# Patient Record
Sex: Male | Born: 2000 | Race: Black or African American | Hispanic: No | Marital: Single | Smoking: Never smoker
Health system: Southern US, Community
[De-identification: ages and names within clinical notes are randomized; demographics above are authoritative.]

## PROBLEM LIST (undated history)

## (undated) DIAGNOSIS — R569 Unspecified convulsions: Secondary | ICD-10-CM

## (undated) DIAGNOSIS — F988 Other specified behavioral and emotional disorders with onset usually occurring in childhood and adolescence: Secondary | ICD-10-CM

## (undated) HISTORY — DX: Unspecified convulsions: R56.9

## (undated) HISTORY — DX: Other specified behavioral and emotional disorders with onset usually occurring in childhood and adolescence: F98.8

## (undated) HISTORY — PX: WISDOM TOOTH EXTRACTION: SHX21

---
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
## 2019-07-23 ENCOUNTER — Ambulatory Visit: Payer: Managed Care, Other (non HMO) | Attending: Internal Medicine

## 2019-07-23 DIAGNOSIS — Z20822 Contact with and (suspected) exposure to covid-19: Secondary | ICD-10-CM

## 2019-07-24 ENCOUNTER — Encounter: Payer: Self-pay | Admitting: *Deleted

## 2019-07-24 LAB — SARS-COV-2, NAA 2 DAY TAT

## 2019-07-24 LAB — NOVEL CORONAVIRUS, NAA: SARS-CoV-2, NAA: DETECTED — AB

## 2019-11-06 ENCOUNTER — Encounter (HOSPITAL_COMMUNITY): Payer: Self-pay

## 2019-11-06 ENCOUNTER — Other Ambulatory Visit: Payer: Self-pay

## 2019-11-06 ENCOUNTER — Ambulatory Visit (HOSPITAL_COMMUNITY)
Admission: EM | Admit: 2019-11-06 | Discharge: 2019-11-06 | Disposition: A | Discharge status: Home / Self Care | Payer: BC Managed Care – PPO

## 2019-11-06 ENCOUNTER — Ambulatory Visit (INDEPENDENT_AMBULATORY_CARE_PROVIDER_SITE_OTHER): Payer: BC Managed Care – PPO

## 2019-11-06 DIAGNOSIS — S99911A Unspecified injury of right ankle, initial encounter: Secondary | ICD-10-CM | POA: Diagnosis not present

## 2019-11-06 DIAGNOSIS — M25571 Pain in right ankle and joints of right foot: Secondary | ICD-10-CM

## 2019-11-06 DIAGNOSIS — S93401A Sprain of unspecified ligament of right ankle, initial encounter: Secondary | ICD-10-CM | POA: Diagnosis not present

## 2019-11-06 MED ORDER — NAPROXEN 500 MG PO TABS: 500.0000 mg | ORAL_TABLET | Freq: Two times a day (BID) | ORAL | 0 refills | Status: AC

## 2019-11-06 NOTE — ED Provider Notes (Signed)
[facility]    CSN: [id number] Arrival date & time: 11/06/19  1234      History   Chief Complaint Chief Complaint  Patient presents with  . Ankle Pain    HPI Carlos Blevins is a 19 y.o. male.   Patient reports for ankle pain.  He reports he rolled the right ankle last night playing basketball.  He reports he does not feel to walk on it since then.  He has been using crutches he had at home.  Iced and elevated last night.  Unable to bear weight today.  Lots of pain on the outside of the ankle and some on the inside. Patient has appointment with [facility] tomorrow.      Past Medical History:  Diagnosis Date  . ADD (attention deficit disorder)   . Seizures [facility])     Patient Active Problem List   Diagnosis Date Noted  . Other convulsions 11/20/2012  . Attention deficit disorder with hyperactivity(314.01) 11/20/2012    Past Surgical History:  Procedure Laterality Date  . WISDOM TOOTH EXTRACTION         Home Medications    Prior to Admission medications   Medication Sig Start Date End Date Taking? Authorizing Provider  naproxen (NAPROSYN) 500 MG tablet Take 1 tablet (500 mg total) by mouth 2 (two) times daily. 11/06/19   Bridgid Printz, Veryl Speak, PA-C  VYVANSE 30 MG capsule Take 30 mg by mouth every morning. 09/21/19   [provider]    Family History Family History  Problem Relation Age of Onset  . Migraines Maternal Aunt   . Seizures Maternal Aunt     Social History Social History   Tobacco Use  . Smoking status: Never Smoker  . Smokeless tobacco: Never Used  Vaping Use  . Vaping Use: Never used  Substance Use Topics  . Alcohol use: Never  . Drug use: Never     Allergies   Patient has no known allergies.   Review of Systems Review of Systems   Physical Exam Triage Vital Signs ED Triage Vitals  Enc Vitals Group     BP 11/06/19 1303 (!) 110/64     Pulse Rate 11/06/19 1303 58     Resp 11/06/19 1303 16     Temp  11/06/19 1303 97.8 F (36.6 C)     Temp Source 11/06/19 1303 Oral     SpO2 11/06/19 1303 100 %     Weight --      Height --      Head Circumference --      Peak Flow --      Pain Score 11/06/19 1305 7     Pain Loc --      Pain Edu? --      Excl. in GC? --    No data found.  Updated Vital Signs BP (!) 110/64 (BP Location: Left Arm)   Pulse 58   Temp 97.8 F (36.6 C) (Oral)   Resp 16   SpO2 100%   Visual Acuity Right Eye Distance:   Left Eye Distance:   Bilateral Distance:    Right Eye Near:   Left Eye Near:    Bilateral Near:     Physical Exam Vitals and nursing note reviewed.  Constitutional:      Appearance: He is well-developed.  HENT:     Head: Normocephalic and atraumatic.  Eyes:     Conjunctiva/sclera: Conjunctivae normal.  Cardiovascular:     Rate and  Rhythm: Normal rate.     Heart sounds: No murmur heard.   Pulmonary:     Effort: Pulmonary effort is normal. No respiratory distress.  Musculoskeletal:     Comments: Significant swelling about the lateral and medial aspect of the right ankle.  There is no malleoli or tenderness, there is some tenderness across the distribution of the ATFL and deltoid ligaments.  No pain with squeeze of tib-fib.  No knee pain.  No base of the fifth metatarsal tenderness.  Neurovascularly intact  Skin:    General: Skin is warm and dry.  Neurological:     Mental Status: He is alert.      UC Treatments / Results  Labs (all labs ordered are listed, but only abnormal results are displayed) Labs Reviewed - No data to display  EKG   Radiology DG Ankle Complete Right  Result Date: 11/06/2019 CLINICAL DATA:  Right ankle injury, pain EXAM: RIGHT ANKLE - COMPLETE 3+ VIEW COMPARISON:  None. FINDINGS: There is no evidence of fracture, dislocation, or joint effusion. There is no evidence of arthropathy or other focal bone abnormality. Diffuse soft tissue edema about the ankle. IMPRESSION: No fracture or dislocation of the  right ankle. Joint spaces are preserved. Diffuse soft tissue edema about the ankle. Electronically Signed   By: Lauralyn Primes M.D.   On: 11/06/2019 13:58    Procedures Procedures (including critical care time)  Medications Ordered in UC Medications - No data to display  Initial Impression / Assessment and Plan / UC Course  I have reviewed the triage vital signs and the nursing notes.  Pertinent labs & imaging results that were available during my care of the patient were reviewed by me and considered in my medical decision making (see chart for details).     #Right ankle sprain Patient is a 19 year old presenting with right ankle sprain.  X-ray negative for fracture.  Given significant soft tissue swelling, will have him follow-up with orthopedics as he is previously scheduled.  Placed in cam walker and given crutches.  Naproxen for pain.  Recommend ice and elevation.  Encouraged to follow-up with orthopedics as previously scheduled, given moderate sprain.  Patient verbalized understanding plan of care Final Clinical Impressions(s) / UC Diagnoses   Final diagnoses:  Sprain of right ankle, unspecified ligament, initial encounter     Discharge Instructions     Keep your follow up with the orthopedic office  Use crutches and wear boot until then  Naproxen 2 times a day  Ice and elevate tonight      ED Prescriptions    Medication Sig Dispense Auth. Provider   naproxen (NAPROSYN) 500 MG tablet Take 1 tablet (500 mg total) by mouth 2 (two) times daily. 30 tablet Janylah Belgrave, Veryl Speak, PA-C     PDMP not reviewed this encounter.   Hermelinda Medicus, PA-C 11/06/19 1458

## 2019-11-06 NOTE — ED Triage Notes (Signed)
Pt c/o pain and swelling to right ankle after rolling it playing basketball last night

## 2019-11-06 NOTE — Discharge Instructions (Signed)
Keep your follow up with the orthopedic office  Use crutches and wear boot until then  Naproxen 2 times a day  Ice and elevate tonight

## 2021-08-06 IMAGING — DX DG ANKLE COMPLETE 3+V*R*
3 series · 3 of 3 positions shown · non-contrast
Comparison: None.

CLINICAL DATA: Right ankle injury, pain

EXAM:
RIGHT ANKLE - COMPLETE 3+ VIEW

[ankle ap]
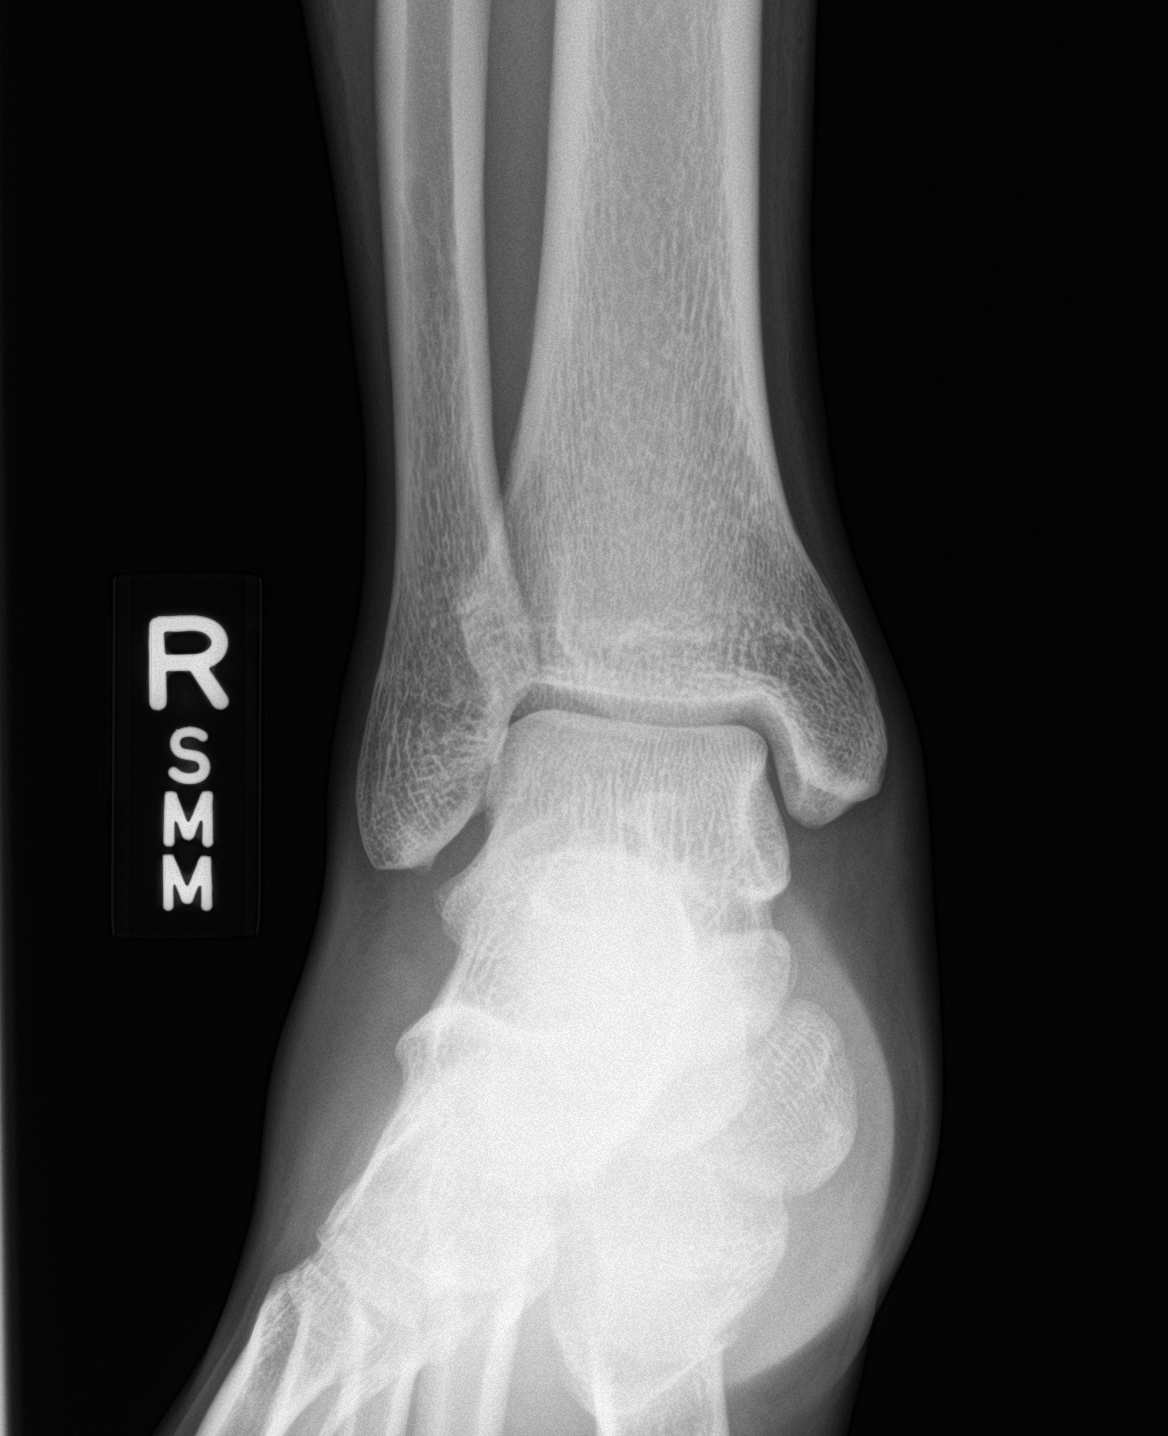

[ankle obl]
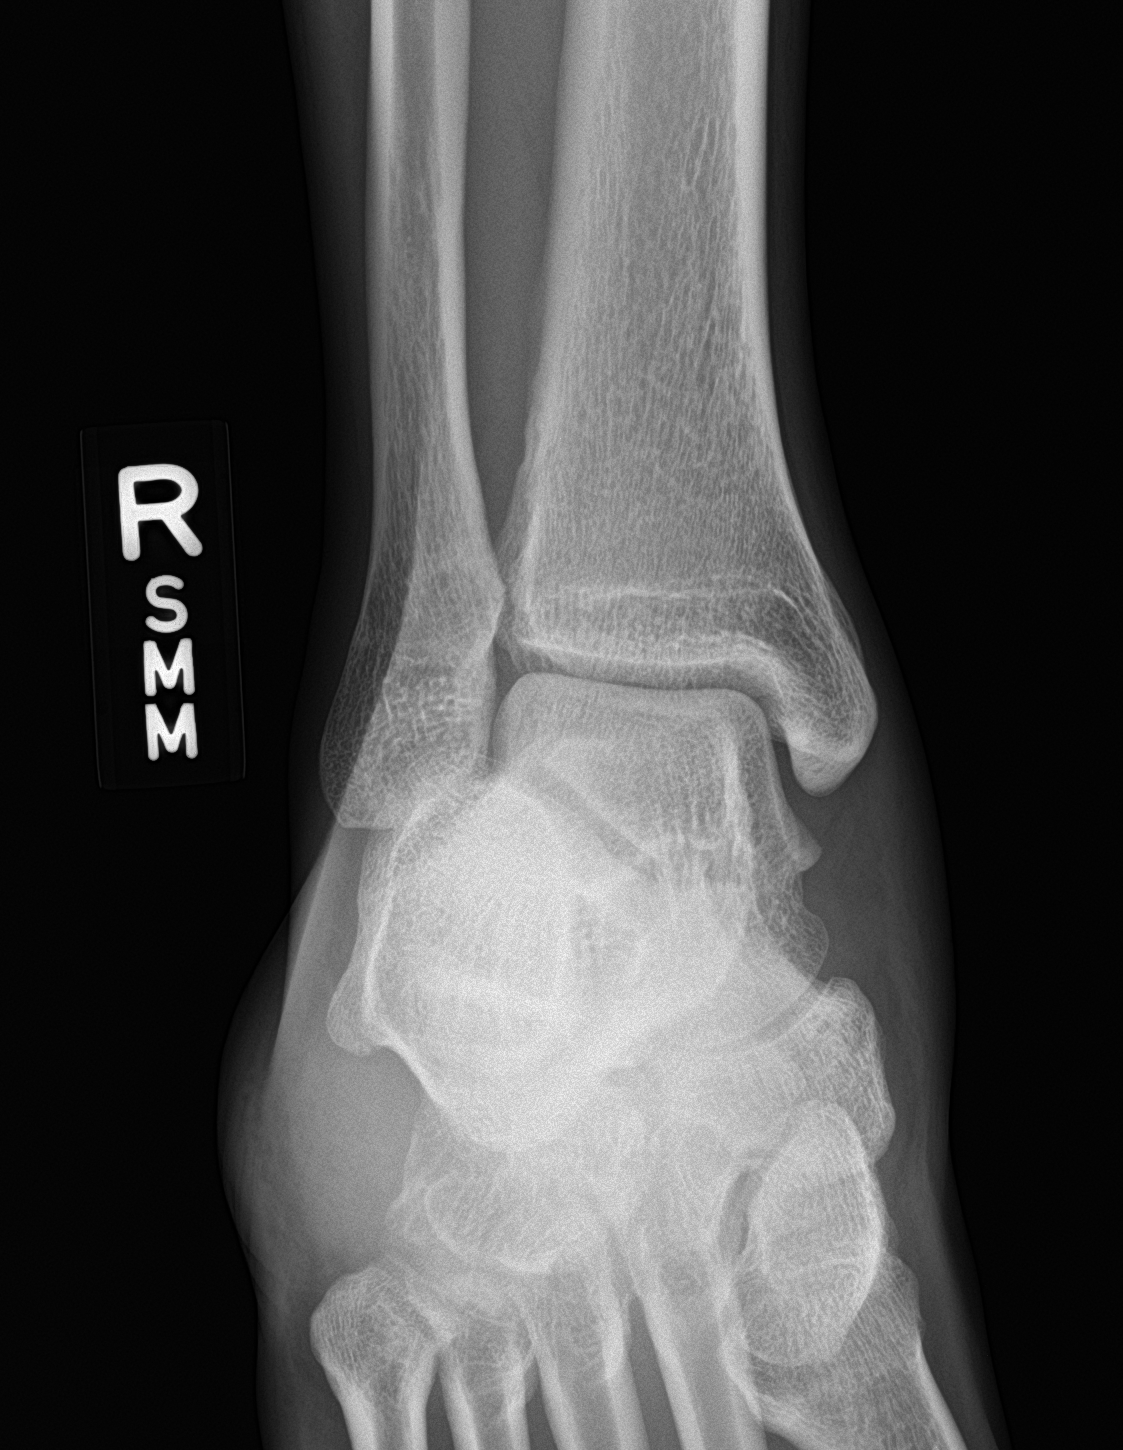

[ankle lat]
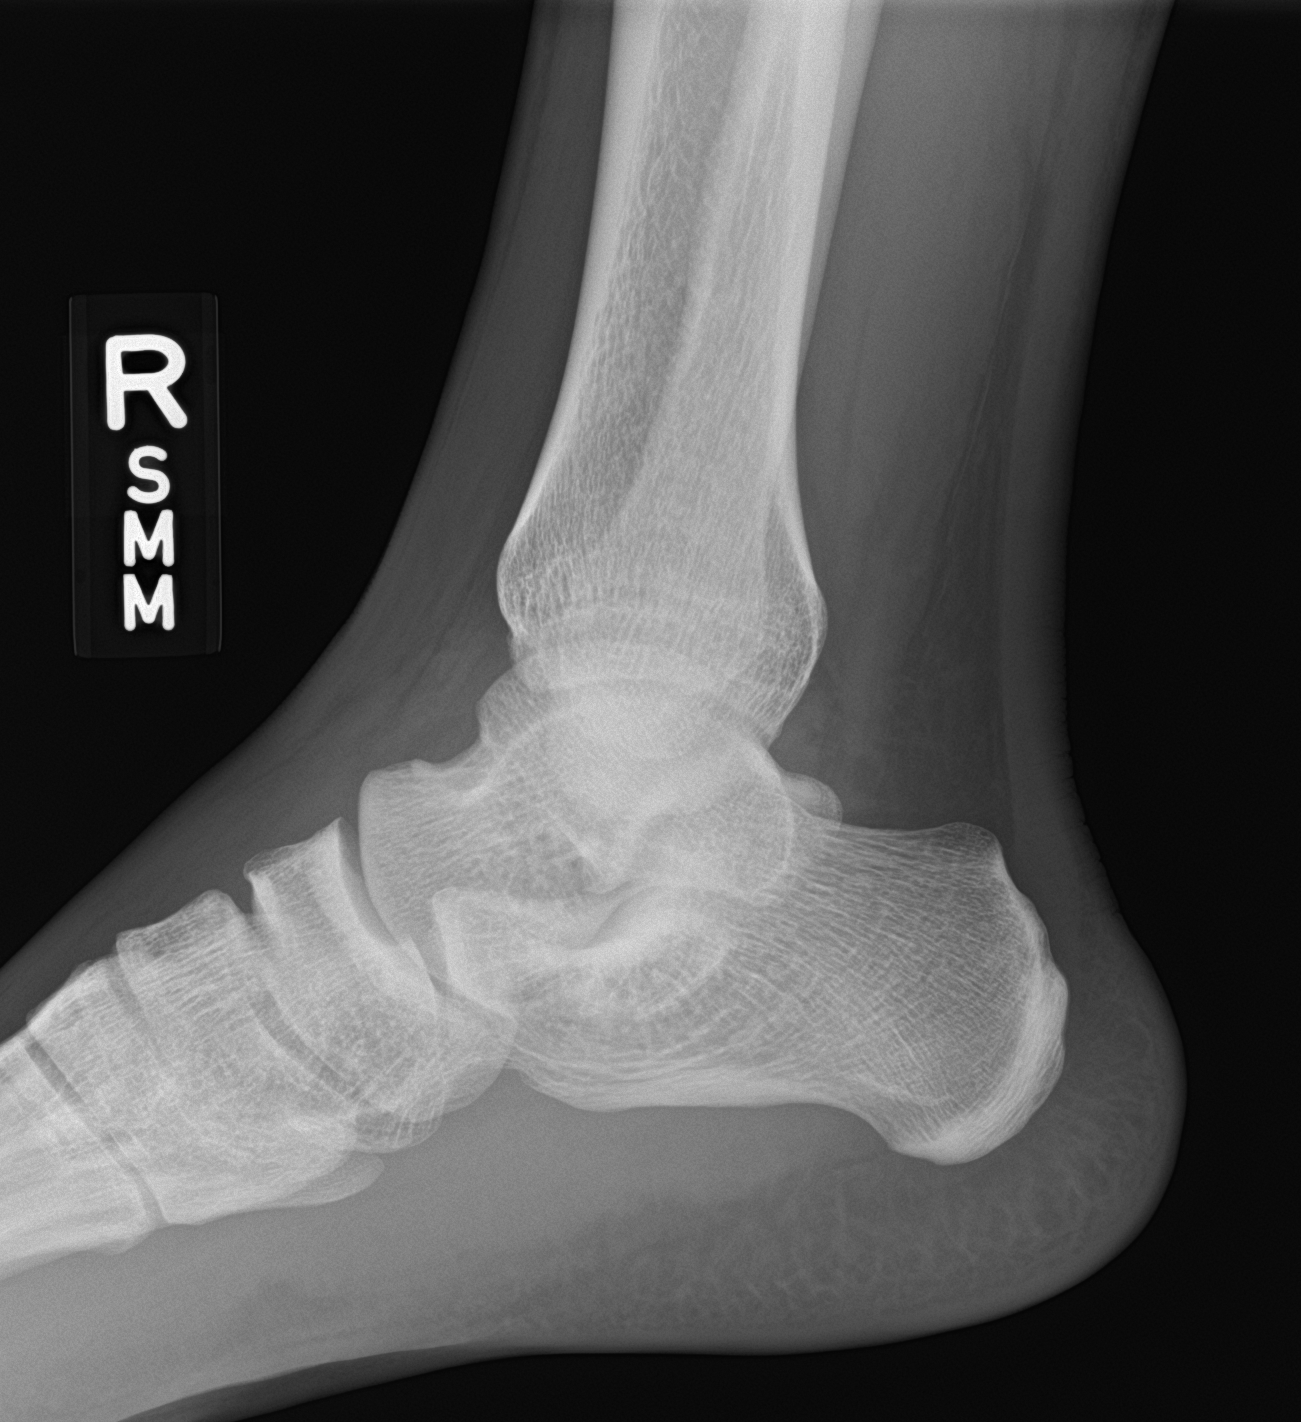

[3 of 3 positions shown; findings below may reference images not displayed]

FINDINGS: There is no evidence of fracture, dislocation, or joint effusion.
There is no evidence of arthropathy or other focal bone abnormality.
Diffuse soft tissue edema about the ankle.
IMPRESSION: No fracture or dislocation of the right ankle. Joint spaces are
preserved. Diffuse soft tissue edema about the ankle.
